# Patient Record
Sex: Male | Born: 1985 | Race: White | Hispanic: No | Marital: Single | Smoking: Former smoker
Health system: Southern US, Community
[De-identification: ages and names within clinical notes are randomized; demographics above are authoritative.]

## PROBLEM LIST (undated history)

## (undated) DIAGNOSIS — E119 Type 2 diabetes mellitus without complications: Secondary | ICD-10-CM

## (undated) DIAGNOSIS — I10 Essential (primary) hypertension: Secondary | ICD-10-CM

---

## 2016-04-02 ENCOUNTER — Emergency Department (HOSPITAL_COMMUNITY): Payer: No Typology Code available for payment source

## 2016-04-02 ENCOUNTER — Emergency Department (HOSPITAL_COMMUNITY)
Admission: EM | Admit: 2016-04-02 | Discharge: 2016-04-02 | Disposition: A | Discharge status: Home / Self Care | Payer: No Typology Code available for payment source | Attending: Emergency Medicine | Admitting: Emergency Medicine

## 2016-04-02 ENCOUNTER — Encounter (HOSPITAL_COMMUNITY): Payer: Self-pay

## 2016-04-02 DIAGNOSIS — Y939 Activity, unspecified: Secondary | ICD-10-CM | POA: Insufficient documentation

## 2016-04-02 DIAGNOSIS — S2232XA Fracture of one rib, left side, initial encounter for closed fracture: Secondary | ICD-10-CM | POA: Insufficient documentation

## 2016-04-02 DIAGNOSIS — F1910 Other psychoactive substance abuse, uncomplicated: Secondary | ICD-10-CM

## 2016-04-02 DIAGNOSIS — S20219A Contusion of unspecified front wall of thorax, initial encounter: Secondary | ICD-10-CM | POA: Insufficient documentation

## 2016-04-02 DIAGNOSIS — F1990 Other psychoactive substance use, unspecified, uncomplicated: Secondary | ICD-10-CM | POA: Diagnosis not present

## 2016-04-02 DIAGNOSIS — Z87891 Personal history of nicotine dependence: Secondary | ICD-10-CM | POA: Insufficient documentation

## 2016-04-02 DIAGNOSIS — Y999 Unspecified external cause status: Secondary | ICD-10-CM | POA: Diagnosis not present

## 2016-04-02 DIAGNOSIS — R1030 Lower abdominal pain, unspecified: Secondary | ICD-10-CM | POA: Insufficient documentation

## 2016-04-02 DIAGNOSIS — Y929 Unspecified place or not applicable: Secondary | ICD-10-CM | POA: Diagnosis not present

## 2016-04-02 DIAGNOSIS — S161XXA Strain of muscle, fascia and tendon at neck level, initial encounter: Secondary | ICD-10-CM | POA: Diagnosis not present

## 2016-04-02 DIAGNOSIS — S300XXA Contusion of lower back and pelvis, initial encounter: Secondary | ICD-10-CM | POA: Insufficient documentation

## 2016-04-02 DIAGNOSIS — S299XXA Unspecified injury of thorax, initial encounter: Secondary | ICD-10-CM | POA: Diagnosis present

## 2016-04-02 DIAGNOSIS — S2239XA Fracture of one rib, unspecified side, initial encounter for closed fracture: Secondary | ICD-10-CM

## 2016-04-02 DIAGNOSIS — R930 Abnormal findings on diagnostic imaging of skull and head, not elsewhere classified: Secondary | ICD-10-CM | POA: Insufficient documentation

## 2016-04-02 LAB — COMPREHENSIVE METABOLIC PANEL
ALT: 29 U/L (ref 17–63)
ANION GAP: 15 (ref 5–15)
AST: 32 U/L (ref 15–41)
Albumin: 4.4 g/dL (ref 3.5–5.0)
Alkaline Phosphatase: 57 U/L (ref 38–126)
BUN: 17 mg/dL (ref 6–20)
CHLORIDE: 105 mmol/L (ref 101–111)
CO2: 22 mmol/L (ref 22–32)
Calcium: 10.3 mg/dL (ref 8.9–10.3)
Creatinine, Ser: 1.17 mg/dL (ref 0.61–1.24)
GFR calc non Af Amer: >60 mL/min (ref >60)
Glucose, Bld: 115 mg/dL — ABNORMAL HIGH (ref 65–99)
POTASSIUM: 3.2 mmol/L — AB (ref 3.5–5.1)
SODIUM: 142 mmol/L (ref 135–145)
Total Bilirubin: 1.3 mg/dL — ABNORMAL HIGH (ref 0.3–1.2)
Total Protein: 7.9 g/dL (ref 6.5–8.1)

## 2016-04-02 LAB — CBC
HCT: 43.4 % (ref 39.0–52.0)
HEMOGLOBIN: 15.2 g/dL (ref 13.0–17.0)
MCH: 28.8 pg (ref 26.0–34.0)
MCHC: 35 g/dL (ref 30.0–36.0)
MCV: 82.4 fL (ref 78.0–100.0)
Platelets: 224 10*3/uL (ref 150–400)
RBC: 5.27 MIL/uL (ref 4.22–5.81)
RDW: 12 % (ref 11.5–15.5)
WBC: 13.7 10*3/uL — AB (ref 4.0–10.5)

## 2016-04-02 LAB — I-STAT CHEM 8, ED
BUN: 18 mg/dL (ref 6–20)
CALCIUM ION: 1.15 mmol/L (ref 1.15–1.40)
Chloride: 104 mmol/L (ref 101–111)
Creatinine, Ser: 1.1 mg/dL (ref 0.61–1.24)
GLUCOSE: 117 mg/dL — AB (ref 65–99)
HCT: 45 % (ref 39.0–52.0)
HEMOGLOBIN: 15.3 g/dL (ref 13.0–17.0)
POTASSIUM: 3.2 mmol/L — AB (ref 3.5–5.1)
Sodium: 141 mmol/L (ref 135–145)
TCO2: 22 mmol/L (ref 0–100)

## 2016-04-02 LAB — PROTIME-INR
INR: 1.19
Prothrombin Time: 15.2 seconds (ref 11.4–15.2)

## 2016-04-02 LAB — ETHANOL: Alcohol, Ethyl (B): <5 mg/dL (ref <5)

## 2016-04-02 MED ORDER — KETOROLAC TROMETHAMINE 30 MG/ML IJ SOLN
30.0000 mg | Freq: Once | INTRAMUSCULAR | Status: DC
  Filled 2016-04-02: qty 1

## 2016-04-02 MED ORDER — MORPHINE SULFATE (PF) 4 MG/ML IV SOLN
4.0000 mg | Freq: Once | INTRAVENOUS | Status: AC
  Administered 2016-04-02: 4 mg via INTRAVENOUS
  Filled 2016-04-02: qty 1

## 2016-04-02 MED ORDER — IOPAMIDOL (ISOVUE-300) INJECTION 61%
INTRAVENOUS | Status: AC
  Administered 2016-04-02: 100 mL
  Filled 2016-04-02: qty 100

## 2016-04-02 MED ORDER — ONDANSETRON HCL 4 MG/2ML IJ SOLN
4.0000 mg | Freq: Once | INTRAMUSCULAR | Status: AC
  Administered 2016-04-02: 4 mg via INTRAVENOUS
  Filled 2016-04-02: qty 2

## 2016-04-02 MED ORDER — OXYCODONE-ACETAMINOPHEN 5-325 MG PO TABS: 2.0000 | ORAL_TABLET | Freq: Four times a day (QID) | ORAL | 0 refills | Status: DC | PRN

## 2016-04-02 MED ORDER — KETOROLAC TROMETHAMINE 30 MG/ML IJ SOLN
30.0000 mg | Freq: Once | INTRAMUSCULAR | Status: AC
  Administered 2016-04-02: 30 mg via INTRAVENOUS

## 2016-04-02 MED ORDER — NAPROXEN 500 MG PO TABS: 500.0000 mg | ORAL_TABLET | Freq: Two times a day (BID) | ORAL | 0 refills | Status: AC
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
## 2016-04-02 NOTE — ED Notes (Signed)
Pt placed in c-collar per VO from Dr. Wilkie AyeHorton
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
## 2016-04-02 NOTE — ED Notes (Signed)
Patient transported to CT 

## 2016-04-02 NOTE — ED Notes (Addendum)
Pt given paper scrubs, wound on left leg dressed, c-collar remains in place for discharge. DC instructions reviewed and pt verbalizes understanding. NAD
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
## 2019-07-24 ENCOUNTER — Inpatient Hospital Stay (HOSPITAL_COMMUNITY)
Admission: EM | Admit: 2019-07-24 | Discharge: 2019-07-26 | DRG: 638 | Disposition: A | Discharge status: Home / Self Care | Payer: Self-pay | Attending: Internal Medicine | Admitting: Internal Medicine

## 2019-07-24 ENCOUNTER — Emergency Department (HOSPITAL_COMMUNITY): Payer: Self-pay

## 2019-07-24 ENCOUNTER — Encounter (HOSPITAL_COMMUNITY): Payer: Self-pay | Admitting: Emergency Medicine

## 2019-07-24 ENCOUNTER — Other Ambulatory Visit: Payer: Self-pay

## 2019-07-24 DIAGNOSIS — E1100 Type 2 diabetes mellitus with hyperosmolarity without nonketotic hyperglycemic-hyperosmolar coma (NKHHC): Principal | ICD-10-CM | POA: Diagnosis present

## 2019-07-24 DIAGNOSIS — N179 Acute kidney failure, unspecified: Secondary | ICD-10-CM | POA: Diagnosis present

## 2019-07-24 DIAGNOSIS — Z597 Insufficient social insurance and welfare support: Secondary | ICD-10-CM

## 2019-07-24 DIAGNOSIS — E0865 Diabetes mellitus due to underlying condition with hyperglycemia: Secondary | ICD-10-CM | POA: Diagnosis present

## 2019-07-24 DIAGNOSIS — I10 Essential (primary) hypertension: Secondary | ICD-10-CM | POA: Diagnosis present

## 2019-07-24 DIAGNOSIS — E871 Hypo-osmolality and hyponatremia: Secondary | ICD-10-CM | POA: Diagnosis present

## 2019-07-24 DIAGNOSIS — Z91120 Patient's intentional underdosing of medication regimen due to financial hardship: Secondary | ICD-10-CM

## 2019-07-24 DIAGNOSIS — Z8249 Family history of ischemic heart disease and other diseases of the circulatory system: Secondary | ICD-10-CM

## 2019-07-24 DIAGNOSIS — Z87891 Personal history of nicotine dependence: Secondary | ICD-10-CM

## 2019-07-24 DIAGNOSIS — Z6837 Body mass index (BMI) 37.0-37.9, adult: Secondary | ICD-10-CM

## 2019-07-24 DIAGNOSIS — E860 Dehydration: Secondary | ICD-10-CM | POA: Diagnosis present

## 2019-07-24 DIAGNOSIS — IMO0002 Reserved for concepts with insufficient information to code with codable children: Secondary | ICD-10-CM | POA: Diagnosis present

## 2019-07-24 DIAGNOSIS — Z20822 Contact with and (suspected) exposure to covid-19: Secondary | ICD-10-CM | POA: Diagnosis present

## 2019-07-24 DIAGNOSIS — T383X6A Underdosing of insulin and oral hypoglycemic [antidiabetic] drugs, initial encounter: Secondary | ICD-10-CM | POA: Diagnosis present

## 2019-07-24 HISTORY — DX: Type 2 diabetes mellitus without complications: E11.9

## 2019-07-24 HISTORY — DX: Essential (primary) hypertension: I10

## 2019-07-24 LAB — CBC
HCT: 46.3 % (ref 39.0–52.0)
Hemoglobin: 15.6 g/dL (ref 13.0–17.0)
MCH: 28.4 pg (ref 26.0–34.0)
MCHC: 33.7 g/dL (ref 30.0–36.0)
MCV: 84.3 fL (ref 80.0–100.0)
Platelets: 221 10*3/uL (ref 150–400)
RBC: 5.49 MIL/uL (ref 4.22–5.81)
RDW: 11.8 % (ref 11.5–15.5)
WBC: 6.5 10*3/uL (ref 4.0–10.5)
nRBC: 0 % (ref 0.0–0.2)

## 2019-07-24 LAB — BASIC METABOLIC PANEL
Anion gap: 15 (ref 5–15)
Anion gap: 9 (ref 5–15)
BUN: 18 mg/dL (ref 6–20)
BUN: 16 mg/dL (ref 6–20)
CO2: 22 mmol/L (ref 22–32)
CO2: 24 mmol/L (ref 22–32)
Calcium: 9.6 mg/dL (ref 8.9–10.3)
Calcium: 8.4 mg/dL — ABNORMAL LOW (ref 8.9–10.3)
Chloride: 91 mmol/L — ABNORMAL LOW (ref 98–111)
Chloride: 98 mmol/L (ref 98–111)
Creatinine, Ser: 1.18 mg/dL (ref 0.61–1.24)
Creatinine, Ser: 0.98 mg/dL (ref 0.61–1.24)
GFR calc Af Amer: >60 mL/min (ref >60)
GFR calc Af Amer: >60 mL/min (ref >60)
GFR calc non Af Amer: >60 mL/min (ref >60)
GFR calc non Af Amer: >60 mL/min (ref >60)
Glucose, Bld: 857 mg/dL (ref 70–99)
Glucose, Bld: 610 mg/dL (ref 70–99)
Potassium: 4.7 mmol/L (ref 3.5–5.1)
Potassium: 4.2 mmol/L (ref 3.5–5.1)
Sodium: 128 mmol/L — ABNORMAL LOW (ref 135–145)
Sodium: 131 mmol/L — ABNORMAL LOW (ref 135–145)

## 2019-07-24 LAB — URINALYSIS, ROUTINE W REFLEX MICROSCOPIC
Bacteria, UA: NONE SEEN
Bilirubin Urine: NEGATIVE
Glucose, UA: >=500 mg/dL — AB
Hgb urine dipstick: NEGATIVE
Ketones, ur: NEGATIVE mg/dL
Leukocytes,Ua: NEGATIVE
Nitrite: NEGATIVE
Protein, ur: NEGATIVE mg/dL
Specific Gravity, Urine: 1.027 (ref 1.005–1.030)
pH: 5 (ref 5.0–8.0)

## 2019-07-24 LAB — CBG MONITORING, ED
Glucose-Capillary: >600 mg/dL (ref 70–99)
Glucose-Capillary: >600 mg/dL (ref 70–99)
Glucose-Capillary: 572 mg/dL (ref 70–99)
Glucose-Capillary: 491 mg/dL — ABNORMAL HIGH (ref 70–99)

## 2019-07-24 LAB — GLUCOSE, CAPILLARY
Glucose-Capillary: 319 mg/dL — ABNORMAL HIGH (ref 70–99)
Glucose-Capillary: 446 mg/dL — ABNORMAL HIGH (ref 70–99)

## 2019-07-24 LAB — POC SARS CORONAVIRUS 2 AG -  ED: SARS Coronavirus 2 Ag: NEGATIVE

## 2019-07-24 LAB — TROPONIN I (HIGH SENSITIVITY)
Troponin I (High Sensitivity): 3 ng/L (ref <18)
Troponin I (High Sensitivity): 4 ng/L (ref <18)

## 2019-07-24 LAB — BRAIN NATRIURETIC PEPTIDE: B Natriuretic Peptide: 14.9 pg/mL (ref 0.0–100.0)

## 2019-07-24 LAB — OSMOLALITY: Osmolality: 311 mOsm/kg — ABNORMAL HIGH (ref 275–295)

## 2019-07-24 MED ORDER — ENOXAPARIN SODIUM 80 MG/0.8ML ~~LOC~~ SOLN
80.0000 mg | SUBCUTANEOUS | Status: DC
  Administered 2019-07-25 – 2019-07-26 (×2): 80 mg via SUBCUTANEOUS
  Filled 2019-07-24 (×2): qty 0.8

## 2019-07-24 MED ORDER — DEXTROSE-NACL 5-0.45 % IV SOLN: INTRAVENOUS | Status: DC

## 2019-07-24 MED ORDER — SODIUM CHLORIDE 0.9 % IV SOLN: INTRAVENOUS | Status: DC

## 2019-07-24 MED ORDER — SODIUM CHLORIDE 0.9 % IV BOLUS
20.0000 mL/kg | Freq: Once | INTRAVENOUS | Status: DC
  Administered 2019-07-24: 19:00:00 1000 mL via INTRAVENOUS

## 2019-07-24 MED ORDER — POTASSIUM CHLORIDE 10 MEQ/100ML IV SOLN
10.0000 meq | INTRAVENOUS | Status: AC
  Administered 2019-07-24 (×2): 10 meq via INTRAVENOUS
  Filled 2019-07-24 (×2): qty 100

## 2019-07-24 MED ORDER — INSULIN REGULAR(HUMAN) IN NACL 100-0.9 UT/100ML-% IV SOLN
INTRAVENOUS | Status: DC
  Administered 2019-07-24: 21:00:00 18 [IU]/h via INTRAVENOUS
  Filled 2019-07-24: qty 100

## 2019-07-24 MED ORDER — DEXTROSE 50 % IV SOLN: 0.0000 mL | INTRAVENOUS | Status: DC | PRN

## 2019-07-24 MED ORDER — SODIUM CHLORIDE 0.9 % IV BOLUS
20.0000 mL/kg | Freq: Once | INTRAVENOUS | Status: AC
  Administered 2019-07-24: 20:00:00 3208 mL via INTRAVENOUS

## 2019-07-24 MED ORDER — INSULIN REGULAR(HUMAN) IN NACL 100-0.9 UT/100ML-% IV SOLN: INTRAVENOUS | Status: DC

## 2019-07-24 NOTE — ED Notes (Signed)
Dr Jacqulyn Bath aware Glucose 857

## 2019-07-24 NOTE — ED Triage Notes (Signed)
Pt reports his blood sugar has been running high for a few months. States he takes his medications when he can afford them. Sob with exertion.

## 2019-07-24 NOTE — H&P (Signed)
History and Physical    Tim Choi GUR:[id number] DOB: [birth year] DOA: 07/24/2019  PCP: Patient, No Pcp Per  Patient coming from: Home.  Chief Complaint: Elevated blood sugar.  HPI: Tim Choi is a 34 y.o. male with history of diabetes mellitus probably type II on insulin and has not been taking it for last almost a month because of financial issues has been having elevated blood sugar readings in the 500s constantly and helps has benign fatigue and weakness.  Polyuria weight loss.  With the symptoms patient presents to the ER.  Denies chest pain nausea vomiting or abdominal pain no fever or chills.   ED Course: In the ER patient was afebrile.  Blood work show blood glucose of 857 with anion gap of 15 creatinine 1.1 sodium 128 CBC largely unremarkable EKG showing nonspecific changes Covid test was negative.  Given the hyperosmolar status patient started on IV fluids and IV insulin infusion and admitted for further management.  Review of Systems: As per HPI, rest all negative.   Past Medical History:  Diagnosis Date  . Diabetes mellitus without complication (HCC)   . Hypertension     History reviewed. No pertinent surgical history.   reports that he has quit smoking. He has never used smokeless tobacco. He reports previous alcohol use. He reports current drug use. Drugs: Methamphetamines and Marijuana.  No Known Allergies  Family History  Problem Relation Age of Onset  . CAD Father   . Diabetes Mellitus II Neg Hx     Prior to Admission medications   Medication Sig Start Date End Date Taking? Authorizing Provider  naproxen (NAPROSYN) 500 MG tablet Take 1 tablet (500 mg total) by mouth 2 (two) times daily. Patient not taking: Reported on 07/24/2019 04/02/16   Horton, Mayer Masker, MD  oxyCODONE-acetaminophen (PERCOCET/ROXICET) 5-325 MG tablet Take 2 tablets by mouth every 6 (six) hours as needed for severe pain. Patient not taking: Reported on 07/24/2019 04/02/16   Horton,  Mayer Masker, MD    Physical Exam: Constitutional: Moderately built and nourished. Vitals:   07/24/19 2000 07/24/19 2100 07/24/19 2110 07/24/19 2210  BP: 134/89 (!) 147/89  (!) 134/93  Pulse: 94 93  99  Resp: 16 16  20   Temp:    98.1 F (36.7 C)  TempSrc:    Oral  SpO2: 96% 97%  97%  Weight:    (!) 161.2 kg  Height:   6\' 9"  (2.057 m) 6\' 9"  (2.057 m)   Eyes: Anicteric no pallor. ENMT: No discharge from the ears eyes nose or mouth. Neck: No mass felt.  No neck rigidity. Respiratory: No rhonchi or crepitations. Cardiovascular: S1-S2 heard. Abdomen: Soft nontender bowel sounds present. Musculoskeletal: No edema.  No joint effusion. Skin: No rash. Neurologic: Alert awake oriented to time place and person.  Moves all extremities. Psychiatric: Appears normal per normal affect.   Labs on Admission: I have personally reviewed following labs and imaging studies  CBC: Recent Labs  Lab 07/24/19 1619  WBC 6.5  HGB 15.6  HCT 46.3  MCV 84.3  PLT 221   Basic Metabolic Panel: Recent Labs  Lab 07/24/19 1619 07/24/19 2105  NA 128* 131*  K 4.7 4.2  CL 91* 98  CO2 22 24  GLUCOSE 857* 610*  BUN 18 16  CREATININE 1.18 0.98  CALCIUM 9.6 8.4*   GFR: Estimated Creatinine Clearance: 187.3 mL/min (by C-G formula based on SCr of 0.98 mg/dL). Liver Function Tests: No results for input(s): AST, ALT, ALKPHOS,  BILITOT, PROT, ALBUMIN in the last 168 hours. No results for input(s): LIPASE, AMYLASE in the last 168 hours. No results for input(s): AMMONIA in the last 168 hours. Coagulation Profile: No results for input(s): INR, PROTIME in the last 168 hours. Cardiac Enzymes: No results for input(s): CKTOTAL, CKMB, CKMBINDEX, TROPONINI in the last 168 hours. BNP (last 3 results) No results for input(s): PROBNP in the last 8760 hours. HbA1C: No results for input(s): HGBA1C in the last 72 hours. CBG: Recent Labs  Lab 07/24/19 1619 07/24/19 2026 07/24/19 2109 07/24/19 2140  07/24/19 2217  GLUCAP >600* >600* 572* 491* 319*   Lipid Profile: No results for input(s): CHOL, HDL, LDLCALC, TRIG, CHOLHDL, LDLDIRECT in the last 72 hours. Thyroid Function Tests: No results for input(s): TSH, T4TOTAL, FREET4, T3FREE, THYROIDAB in the last 72 hours. Anemia Panel: No results for input(s): VITAMINB12, FOLATE, FERRITIN, TIBC, IRON, RETICCTPCT in the last 72 hours. Urine analysis:    Component Value Date/Time   COLORURINE STRAW (A) 07/24/2019 1630   APPEARANCEUR CLEAR 07/24/2019 1630   LABSPEC 1.027 07/24/2019 1630   PHURINE 5.0 07/24/2019 1630   GLUCOSEU >=500 (A) 07/24/2019 1630   HGBUR NEGATIVE 07/24/2019 1630   BILIRUBINUR NEGATIVE 07/24/2019 1630   KETONESUR NEGATIVE 07/24/2019 1630   PROTEINUR NEGATIVE 07/24/2019 1630   NITRITE NEGATIVE 07/24/2019 1630   LEUKOCYTESUR NEGATIVE 07/24/2019 1630   Sepsis Labs: @LABRCNTIP (procalcitonin:4,lacticidven:4) )No results found for this or any previous visit (from the past 240 hour(s)).   Radiological Exams on Admission: DG Chest Port 1 View  Result Date: 07/24/2019 CLINICAL DATA:  34 year old male with shortness of breath hypoglycemia. EXAM: PORTABLE CHEST 1 VIEW COMPARISON:  Portable chest 04/02/2016. FINDINGS: Portable AP upright views at 1930 hours. Mildly increased elevation of the right hemidiaphragm since 2017. Mediastinal contours remain normal. Visualized tracheal air column is within normal limits. Allowing for portable technique the lungs are clear. No pneumothorax. Paucity of bowel gas in the upper abdomen. No osseous abnormality identified. IMPRESSION: Negative portable chest. Electronically Signed   By: Genevie Ann M.D.   On: 07/24/2019 19:39    EKG: Independently reviewed.  Normal sinus rhythm with nonspecific changes.  Assessment/Plan Principal Problem:   Type 2 diabetes mellitus with hyperosmolar nonketotic hyperglycemia (HCC) Active Problems:   Diabetes mellitus due to underlying condition, uncontrolled  (HCC)   Hyperosmolar non-ketotic state due to type 2 diabetes mellitus ([place])    1. Hyperosmolar nonketotic uncontrolled diabetes mellitus type 2 likely from noncompliance with medication for which patient has been started on IV fluids and IV insulin infusion.  Check hemoglobin A1c.  Once blood sugar is less than 250 will change patient's insulin infusion to Lantus or other long-acting insulin with sliding scale coverage.  We will get the case manager consult with regarding the patient's medications and follow-up.  Advised about being compliant with medications. 2. Acute renal failure and hyponatremia likely from hyperglycemia and dehydration for which patient is receiving fluids and insulin.  Follow metabolic panel closely. 3. History of hypertension presently not on any medications.  Patient used to take lisinopril which patient stopped because blood pressure improved.  Closely follow blood pressure trends.  May have to restart if blood pressure continues to remain high.   DVT prophylaxis: Lovenox. Code Status: Full code. Family Communication: Discussed with patient. Disposition Plan: Home. Consults called: None. Admission status: Observation.   Rise Patience MD [facility] Pager [phone].  If 7PM-7AM, please contact night-coverage [email or web address] Password [facility]  07/24/2019, 11:13 PM

## 2019-07-24 NOTE — ED Provider Notes (Signed)
[facility] EMERGENCY DEPARTMENT Provider Note   CSN: [id number] Arrival date & time: 07/24/19  1522     History Chief Complaint  Patient presents with  . Hyperglycemia    Tim Choi is a 34 y.o. male with a past medical history of diabetes and hypertension.  Patient has no health insurance and no primary care doctor to manage his chronic medical discharge conditions.  He states that he has been buying long-acting and Dacquisto acting insulin over-the-counter and he was taught how to try to carb count however he has been unable to get his blood sugars below 500 on a regular basis and also states that he has not been able to afford his insulin for a few weeks.  He has noticed that he has been having voluminous urinations without burning, hematuria, or suprapubic pain.  He is also been feeling extremely fatigued and states that he has become noticeably more [place] of breath.  He has 3 flights of stairs at his house and states that by the time he reaches the third floor he is totally gasping for air and has to stop.  This is new for him.  He denies fevers, chills, abdominal pain, chest pain, or known Covid exposures.  Vomiting.  HPI     Past Medical History:  Diagnosis Date  . Diabetes mellitus without complication (HCC)   . Hypertension     There are no problems to display for this patient.   History reviewed. No pertinent surgical history.     No family history on file.  Social History   Tobacco Use  . Smoking status: Former Games developer  . Smokeless tobacco: Never Used  Substance Use Topics  . Alcohol use: Not on file  . Drug use: Yes    Types: Methamphetamines, Marijuana    Home Medications Prior to Admission medications   Medication Sig Start Date End Date Taking? Authorizing Provider  naproxen (NAPROSYN) 500 MG tablet Take 1 tablet (500 mg total) by mouth 2 (two) times daily. 04/02/16   Horton, Mayer Masker, MD  oxyCODONE-acetaminophen (PERCOCET/ROXICET)  5-325 MG tablet Take 2 tablets by mouth every 6 (six) hours as needed for severe pain. 04/02/16   Horton, Mayer Masker, MD    Allergies    Patient has no known allergies.  Review of Systems   Review of Systems Ten systems reviewed and are negative for acute change, except as noted in the HPI.   Physical Exam Updated Vital Signs BP (!) 141/101 (BP Location: Right Arm)   Pulse (!) 101   Temp 99 F (37.2 C) (Oral)   Resp 16   SpO2 95%   Physical Exam Vitals and nursing note reviewed.  Constitutional:      General: He is not in acute distress.    Appearance: He is well-developed. He is not diaphoretic.  HENT:     Head: Normocephalic and atraumatic.  Eyes:     General: No scleral icterus.    Conjunctiva/sclera: Conjunctivae normal.  Cardiovascular:     Rate and Rhythm: Normal rate and regular rhythm.     Heart sounds: Normal heart sounds.  Pulmonary:     Effort: Pulmonary effort is normal. No respiratory distress.     Breath sounds: Normal breath sounds.  Abdominal:     Palpations: Abdomen is soft.     Tenderness: There is no abdominal tenderness.  Musculoskeletal:     Cervical back: Normal range of motion and neck supple.  Skin:    General:  Skin is warm and dry.  Neurological:     Mental Status: He is alert.  Psychiatric:        Behavior: Behavior normal.     ED Results / Procedures / Treatments   Labs (all labs ordered are listed, but only abnormal results are displayed) Labs Reviewed  BASIC METABOLIC PANEL - Abnormal; Notable for the following components:      Result Value   Sodium 128 (*)    Chloride 91 (*)    Glucose, Bld 857 (*)    All other components within normal limits  URINALYSIS, ROUTINE W REFLEX MICROSCOPIC - Abnormal; Notable for the following components:   Color, Urine STRAW (*)    Glucose, UA >=500 (*)    All other components within normal limits  CBG MONITORING, ED - Abnormal; Notable for the following components:   Glucose-Capillary >600 (*)      All other components within normal limits  CBC  OSMOLALITY    EKG None  Radiology No results found.  Procedures .Critical Care Performed by: Margarita Mail, PA-C Authorized by: Margarita Mail, PA-C   Critical care provider statement:    Critical care time (minutes):  50   Critical care time was exclusive of:  Separately billable procedures and treating other patients   Critical care was necessary to treat or prevent imminent or life-threatening deterioration of the following conditions:  Metabolic crisis   Critical care was time spent personally by me on the following activities:  Discussions with consultants, evaluation of patient's response to treatment, examination of patient, ordering and performing treatments and interventions, ordering and review of laboratory studies, ordering and review of radiographic studies, pulse oximetry, re-evaluation of patient's condition, obtaining history from patient or surrogate and review of old charts   (including critical care time)  Medications Ordered in ED Medications  insulin regular, human (MYXREDLIN) 100 units/ 100 mL infusion (has no administration in time range)  0.9 %  sodium chloride infusion (has no administration in time range)  dextrose 5 %-0.45 % sodium chloride infusion (has no administration in time range)  dextrose 50 % solution 0-50 mL (has no administration in time range)  sodium chloride 0.9 % bolus 20 mL/kg (has no administration in time range)  potassium chloride 10 mEq in 100 mL IVPB (has no administration in time range)    ED Course  I have reviewed the triage vital signs and the nursing notes.  Pertinent labs & imaging results that were available during my care of the patient were reviewed by me and considered in my medical decision making (see chart for details).    MDM Rules/Calculators/A&P                     34 year old male with history of insulin-dependent diabetes has been out of his medications for  several week and having difficulty controlling his sugars at home.  Patient presented to the emergency department with markedly elevated blood sugars.  Review of labs shows that his osmolality is slightly elevated.  Urine shows high glucose level.  CBC without elevated white blood cell count.  Normal BNP.  Patient has no ketones or anion gap suggestive of DKA.  Patient receiving fluids, IV insulin.  He will be need admission for hyperosmolar hyperglycemic state. Final Clinical Impression(s) / ED Diagnoses Final diagnoses:  Hyperosmolar hyperglycemic state (HHS) [facility])    Rx / DC Orders ED Discharge Orders    None       Margarita Mail,  PA-C 07/24/19 2324    Maia Plan, MD 07/25/19 1252

## 2019-07-25 ENCOUNTER — Telehealth: Payer: Self-pay

## 2019-07-25 DIAGNOSIS — E1100 Type 2 diabetes mellitus with hyperosmolarity without nonketotic hyperglycemic-hyperosmolar coma (NKHHC): Secondary | ICD-10-CM | POA: Diagnosis present

## 2019-07-25 LAB — BASIC METABOLIC PANEL
Anion gap: 10 (ref 5–15)
Anion gap: 10 (ref 5–15)
Anion gap: 11 (ref 5–15)
BUN: 12 mg/dL (ref 6–20)
BUN: 13 mg/dL (ref 6–20)
BUN: 15 mg/dL (ref 6–20)
CO2: 25 mmol/L (ref 22–32)
CO2: 26 mmol/L (ref 22–32)
CO2: 26 mmol/L (ref 22–32)
Calcium: 8.8 mg/dL — ABNORMAL LOW (ref 8.9–10.3)
Calcium: 8.9 mg/dL (ref 8.9–10.3)
Calcium: 9.1 mg/dL (ref 8.9–10.3)
Chloride: 102 mmol/L (ref 98–111)
Chloride: 102 mmol/L (ref 98–111)
Chloride: 103 mmol/L (ref 98–111)
Creatinine, Ser: 0.77 mg/dL (ref 0.61–1.24)
Creatinine, Ser: 0.8 mg/dL (ref 0.61–1.24)
Creatinine, Ser: 0.89 mg/dL (ref 0.61–1.24)
GFR calc Af Amer: >60 mL/min (ref >60)
GFR calc Af Amer: >60 mL/min (ref >60)
GFR calc Af Amer: >60 mL/min (ref >60)
GFR calc non Af Amer: >60 mL/min (ref >60)
GFR calc non Af Amer: >60 mL/min (ref >60)
GFR calc non Af Amer: >60 mL/min (ref >60)
Glucose, Bld: 222 mg/dL — ABNORMAL HIGH (ref 70–99)
Glucose, Bld: 243 mg/dL — ABNORMAL HIGH (ref 70–99)
Glucose, Bld: 345 mg/dL — ABNORMAL HIGH (ref 70–99)
Potassium: 3.5 mmol/L (ref 3.5–5.1)
Potassium: 4.1 mmol/L (ref 3.5–5.1)
Potassium: 4.1 mmol/L (ref 3.5–5.1)
Sodium: 138 mmol/L (ref 135–145)
Sodium: 138 mmol/L (ref 135–145)
Sodium: 139 mmol/L (ref 135–145)

## 2019-07-25 LAB — GLUCOSE, CAPILLARY
Glucose-Capillary: 158 mg/dL — ABNORMAL HIGH (ref 70–99)
Glucose-Capillary: 225 mg/dL — ABNORMAL HIGH (ref 70–99)
Glucose-Capillary: 290 mg/dL — ABNORMAL HIGH (ref 70–99)
Glucose-Capillary: 312 mg/dL — ABNORMAL HIGH (ref 70–99)
Glucose-Capillary: 380 mg/dL — ABNORMAL HIGH (ref 70–99)
Glucose-Capillary: 408 mg/dL — ABNORMAL HIGH (ref 70–99)

## 2019-07-25 LAB — SARS CORONAVIRUS 2 (TAT 6-24 HRS): SARS Coronavirus 2: NEGATIVE

## 2019-07-25 LAB — CBC
HCT: 41.5 % (ref 39.0–52.0)
Hemoglobin: 14.7 g/dL (ref 13.0–17.0)
MCH: 28.8 pg (ref 26.0–34.0)
MCHC: 35.4 g/dL (ref 30.0–36.0)
MCV: 81.4 fL (ref 80.0–100.0)
Platelets: 201 10*3/uL (ref 150–400)
RBC: 5.1 MIL/uL (ref 4.22–5.81)
RDW: 11.8 % (ref 11.5–15.5)
WBC: 6.8 10*3/uL (ref 4.0–10.5)
nRBC: 0 % (ref 0.0–0.2)

## 2019-07-25 LAB — HIV ANTIBODY (ROUTINE TESTING W REFLEX): HIV Screen 4th Generation wRfx: NONREACTIVE

## 2019-07-25 LAB — OSMOLALITY: Osmolality: 296 mOsm/kg — ABNORMAL HIGH (ref 275–295)

## 2019-07-25 MED ORDER — PROMETHAZINE HCL 25 MG/ML IJ SOLN
12.5000 mg | Freq: Four times a day (QID) | INTRAMUSCULAR | Status: DC | PRN
  Filled 2019-07-25: qty 1

## 2019-07-25 MED ORDER — LISINOPRIL 10 MG PO TABS
10.0000 mg | ORAL_TABLET | Freq: Every day | ORAL | Status: DC
  Administered 2019-07-25 – 2019-07-26 (×2): 10 mg via ORAL
  Filled 2019-07-25 (×2): qty 1

## 2019-07-25 MED ORDER — ACETAMINOPHEN 325 MG PO TABS
650.0000 mg | ORAL_TABLET | Freq: Four times a day (QID) | ORAL | Status: DC | PRN
  Administered 2019-07-25: 08:00:00 650 mg via ORAL
  Filled 2019-07-25: qty 2

## 2019-07-25 MED ORDER — INSULIN ASPART 100 UNIT/ML ~~LOC~~ SOLN
0.0000 [IU] | Freq: Every day | SUBCUTANEOUS | Status: DC
  Administered 2019-07-25: 22:00:00 4 [IU] via SUBCUTANEOUS

## 2019-07-25 MED ORDER — INSULIN ASPART 100 UNIT/ML ~~LOC~~ SOLN
0.0000 [IU] | Freq: Three times a day (TID) | SUBCUTANEOUS | Status: DC
  Administered 2019-07-25: 08:00:00 8 [IU] via SUBCUTANEOUS

## 2019-07-25 MED ORDER — INSULIN GLARGINE 100 UNIT/ML ~~LOC~~ SOLN
20.0000 [IU] | Freq: Every day | SUBCUTANEOUS | Status: DC
  Filled 2019-07-25: qty 0.2

## 2019-07-25 MED ORDER — INSULIN GLARGINE 100 UNIT/ML ~~LOC~~ SOLN
40.0000 [IU] | Freq: Every day | SUBCUTANEOUS | Status: DC
  Administered 2019-07-25: 02:00:00 40 [IU] via SUBCUTANEOUS
  Filled 2019-07-25 (×2): qty 0.4

## 2019-07-25 MED ORDER — HYDROMORPHONE HCL 1 MG/ML IJ SOLN
1.0000 mg | Freq: Once | INTRAMUSCULAR | Status: AC
  Administered 2019-07-25: 12:00:00 1 mg via INTRAVENOUS
  Filled 2019-07-25: qty 1

## 2019-07-25 MED ORDER — INSULIN ASPART 100 UNIT/ML ~~LOC~~ SOLN
10.0000 [IU] | Freq: Three times a day (TID) | SUBCUTANEOUS | Status: DC
  Administered 2019-07-25 – 2019-07-26 (×3): 10 [IU] via SUBCUTANEOUS

## 2019-07-25 MED ORDER — INSULIN ASPART 100 UNIT/ML ~~LOC~~ SOLN
0.0000 [IU] | Freq: Three times a day (TID) | SUBCUTANEOUS | Status: DC
  Administered 2019-07-25: 18:00:00 20 [IU] via SUBCUTANEOUS
  Administered 2019-07-26: 12:00:00 7 [IU] via SUBCUTANEOUS
  Administered 2019-07-26: 08:00:00 15 [IU] via SUBCUTANEOUS

## 2019-07-25 MED ORDER — KETOROLAC TROMETHAMINE 30 MG/ML IJ SOLN
30.0000 mg | Freq: Three times a day (TID) | INTRAMUSCULAR | Status: DC | PRN
  Administered 2019-07-25: 10:00:00 30 mg via INTRAVENOUS
  Filled 2019-07-25: qty 1

## 2019-07-25 MED ORDER — INSULIN ASPART 100 UNIT/ML ~~LOC~~ SOLN
20.0000 [IU] | Freq: Once | SUBCUTANEOUS | Status: AC
  Administered 2019-07-25: 20 [IU] via SUBCUTANEOUS

## 2019-07-25 MED ORDER — INSULIN GLARGINE 100 UNIT/ML ~~LOC~~ SOLN
20.0000 [IU] | Freq: Two times a day (BID) | SUBCUTANEOUS | Status: DC
  Filled 2019-07-25: qty 0.2

## 2019-07-25 MED ORDER — METOCLOPRAMIDE HCL 5 MG PO TABS
5.0000 mg | ORAL_TABLET | Freq: Three times a day (TID) | ORAL | Status: DC
  Administered 2019-07-25 – 2019-07-26 (×3): 5 mg via ORAL
  Filled 2019-07-25 (×3): qty 1

## 2019-07-25 MED ORDER — INSULIN ASPART 100 UNIT/ML ~~LOC~~ SOLN: 6.0000 [IU] | Freq: Three times a day (TID) | SUBCUTANEOUS | Status: DC

## 2019-07-25 MED ORDER — ONDANSETRON HCL 4 MG/2ML IJ SOLN
4.0000 mg | Freq: Four times a day (QID) | INTRAMUSCULAR | Status: DC | PRN
  Administered 2019-07-25: 10:00:00 4 mg via INTRAVENOUS
  Filled 2019-07-25: qty 2

## 2019-07-25 MED ORDER — INSULIN GLARGINE 100 UNIT/ML ~~LOC~~ SOLN
30.0000 [IU] | Freq: Two times a day (BID) | SUBCUTANEOUS | Status: DC
  Administered 2019-07-25: 30 [IU] via SUBCUTANEOUS
  Filled 2019-07-25 (×3): qty 0.3

## 2019-07-25 NOTE — Progress Notes (Signed)
Toniann Fail MD made aware of patient BMP results, current FSBS, and endo tool protocol. MD chose to start SSI and Lantus and d/c insulin gtt, IV fluids and endo tool. RN will follow new orders and continue to monitor patient closely.

## 2019-07-25 NOTE — Care Management (Signed)
1715 07-25-19 Case Manager received referral for primary care provider and medication assistance. Case Manager then reached out to Robyne Peers Case Production designer, theatre/television/film at the [facility]. Erskine Squibb was able to schedule the patient an appointment and it has been placed on the after visit summary for hospital follow up. Patient was consulted by the diabetes coordinator- [facility] only has Basaglar and Humalog available at this time. Case Manager will continue to follow for additional transition of care needs. Gala Lewandowsky, RN,BSN Case Manager [phone]

## 2019-07-25 NOTE — Progress Notes (Signed)
PROGRESS NOTE    Tim Choi  YHC:[id number] DOB: April 06, 1986 DOA: 07/24/2019 PCP: Patient, No Pcp Per   Brief Narrative:  Tim Choi is a 34 y.o. male with history of diabetes mellitus probably type II on insulin and has not been taking it for last almost a month because of financial issues came to ED with fatigue and weakness.  Found to have elevated blood sugars and 800.  Admitted for HHS.  Initially treated with insulin infusion.  Subjective: Patient was complaining of generalized malaise and nausea.  No vomiting.  No fever or chills.  Denies any sick contacts.  COVID-19 negative.  Assessment & Plan:   Principal Problem:   Type 2 diabetes mellitus with hyperosmolar nonketotic hyperglycemia (HCC) Active Problems:   Diabetes mellitus due to underlying condition, uncontrolled (HCC)   Hyperosmolar non-ketotic state due to type 2 diabetes mellitus (HCC)   Hyperosmolar hyperglycemic state (HHS) (HCC)  Hyperosmolar nonketotic uncontrolled diabetes mellitus type 2. Likely secondary to not using his meds.  He was using NPH from [employer] 20 units in the morning, 28 at night with regular insulin for mealtime coverage up to 20 units. A1c elevated at 16.5. CBG in 300-400s Insulin infusion has been discontinued and he was placed on sliding scale. -Add Lantus 30 units twice daily. -NovoLog 10 units with meals. -SSI resistant scale. -New to monitor.  Generalized malaise and nausea.  Patient was complaining of feeling nauseated after eating anything.  May be some element of gastroparesis.  No documentation.  Might be some viral illness causing generalized malaise.  COVID-19 negative. -Supportive care. -Add Reglan 5 mg 3 times a day before meals and see the response.  Elevated blood pressure. -No prior history of hypertension. -We will add lisinopril 10 mg daily and monitor.  Electrolyte abnormalities.  Resolved  Objective: Vitals:   07/25/19 0509 07/25/19 0748 07/25/19 0913 07/25/19  1145  BP: (!) 144/94 (!) 149/106  (!) 169/106  Pulse: 82 85  89  Resp: 20     Temp: 97.8 F (36.6 C) 97.9 F (36.6 C) 98.1 F (36.7 C) 98.2 F (36.8 C)  TempSrc: Oral Oral Oral Oral  SpO2: 100% 100%  99%  Weight:      Height:        Intake/Output Summary (Last 24 hours) at 07/25/2019 1516 Last data filed at 07/25/2019 0800 Gross per 24 hour  Intake 815.21 ml  Output --  Net 815.21 ml   Filed Weights   07/24/19 1929 07/24/19 2210  Weight: (!) 160.4 kg (!) 161.2 kg    Examination:  General exam: Morbidly obese gentleman, appears calm and comfortable  Respiratory system: Clear to auscultation. Respiratory effort normal. Cardiovascular system: S1 & S2 heard, RRR. No JVD, murmurs, rubs, gallops or clicks. Gastrointestinal system: Soft, nontender, nondistended, bowel sounds positive. Central nervous system: Alert and oriented. No focal neurological deficits.Symmetric 5 x 5 power. Extremities: No edema, no cyanosis, pulses intact and symmetrical. Skin: No rashes, lesions or ulcers Psychiatry: Judgement and insight appear normal. Mood & affect appropriate.    DVT prophylaxis: Lovenox Code Status: Full Family Communication: No family at bedside Disposition Plan: Pending improvement-most likely home tomorrow.  Consultants:   None  Procedures:  Antimicrobials:   Data Reviewed: I have personally reviewed following labs and imaging studies  CBC: Recent Labs  Lab 07/24/19 1619 07/24/19 2347  WBC 6.5 6.8  HGB 15.6 14.7  HCT 46.3 41.5  MCV 84.3 81.4  PLT 221 201   Basic Metabolic Panel: Recent  Labs  Lab 07/24/19 1619 07/24/19 2105 07/24/19 2347 07/25/19 0255 07/25/19 0657  NA 128* 131* 138 138 139  K 4.7 4.2 3.5 4.1 4.1  CL 91* 98 103 102 102  CO2 22 24 25 26 26   GLUCOSE 857* 610* 222* 243* 345*  BUN 18 16 15 13 12   CREATININE 1.18 0.98 0.80 0.77 0.89  CALCIUM 9.6 8.4* 9.1 8.9 8.8*   GFR: Estimated Creatinine Clearance: 206.2 mL/min (by C-G formula  based on SCr of 0.89 mg/dL). Liver Function Tests: No results for input(s): AST, ALT, ALKPHOS, BILITOT, PROT, ALBUMIN in the last 168 hours. No results for input(s): LIPASE, AMYLASE in the last 168 hours. No results for input(s): AMMONIA in the last 168 hours. Coagulation Profile: No results for input(s): INR, PROTIME in the last 168 hours. Cardiac Enzymes: No results for input(s): CKTOTAL, CKMB, CKMBINDEX, TROPONINI in the last 168 hours. BNP (last 3 results) No results for input(s): PROBNP in the last 8760 hours. HbA1C: Recent Labs    07/24/19 2347  HGBA1C 16.5*   CBG: Recent Labs  Lab 07/24/19 2321 07/25/19 0027 07/25/19 0125 07/25/19 0745 07/25/19 1203  GLUCAP 446* 158* 225* 290* 408*   Lipid Profile: No results for input(s): CHOL, HDL, LDLCALC, TRIG, CHOLHDL, LDLDIRECT in the last 72 hours. Thyroid Function Tests: No results for input(s): TSH, T4TOTAL, FREET4, T3FREE, THYROIDAB in the last 72 hours. Anemia Panel: No results for input(s): VITAMINB12, FOLATE, FERRITIN, TIBC, IRON, RETICCTPCT in the last 72 hours. Sepsis Labs: No results for input(s): PROCALCITON, LATICACIDVEN in the last 168 hours.  Recent Results (from the past 240 hour(s))  SARS CORONAVIRUS 2 (TAT 6-24 HRS) Nasopharyngeal Nasopharyngeal Swab     Status: None   Collection Time: 07/24/19  8:14 PM   Specimen: Nasopharyngeal Swab  Result Value Ref Range Status   SARS Coronavirus 2 NEGATIVE NEGATIVE Final    Comment: (NOTE) SARS-CoV-2 target nucleic acids are NOT DETECTED. The SARS-CoV-2 RNA is generally detectable in upper and lower respiratory specimens during the acute phase of infection. Negative results do not preclude SARS-CoV-2 infection, do not rule out co-infections with other pathogens, and should not be used as the sole basis for treatment or other patient management decisions. Negative results must be combined with clinical observations, patient history, and epidemiological information.  The expected result is Negative. Fact Sheet for Patients: SugarRoll.be Fact Sheet for Healthcare Providers: https://www.woods-mathews.com/ This test is not yet approved or cleared by the [place] FDA and  has been authorized for detection and/or diagnosis of SARS-CoV-2 by FDA under an Emergency Use Authorization (EUA). This EUA will remain  in effect (meaning this test can be used) for the duration of the COVID-19 declaration under Section 56 4(b)(1) of the Act, 21 U.S.C. section 360bbb-3(b)(1), unless the authorization is terminated or revoked sooner. Performed at [facility], [place] [address], [place], [place] [address]      Radiology Studies: DG Chest [place] 1 View  Result Date: 07/24/2019 CLINICAL DATA:  34 year old male with shortness of breath hypoglycemia. EXAM: PORTABLE CHEST 1 VIEW COMPARISON:  Portable chest 04/02/2016. FINDINGS: Portable AP upright views at 1930 hours. Mildly increased elevation of the right hemidiaphragm since 2017. Mediastinal contours remain normal. Visualized tracheal air column is within normal limits. Allowing for portable technique the lungs are clear. No pneumothorax. Paucity of bowel gas in the upper abdomen. No osseous abnormality identified. IMPRESSION: Negative portable chest. Electronically Signed   By: Genevie Ann M.D.   On: 07/24/2019 19:39  Scheduled Meds: . enoxaparin (LOVENOX) injection  80 mg Subcutaneous Q24H  . insulin aspart  0-20 Units Subcutaneous TID WC  . insulin aspart  0-5 Units Subcutaneous QHS  . insulin aspart  10 Units Subcutaneous TID WC  . insulin glargine  30 Units Subcutaneous BID  . metoCLOPramide  5 mg Oral TID AC   Continuous Infusions:   LOS: 0 days   Time spent: 35 minutes.  Arnetha Courser, MD [facility] Pager [phone]  If 7PM-7AM, please contact night-coverage [email or web address] Password [facility] 07/25/2019, 3:16 PM   This record has been created using  Dragon voice recognition software. Errors have been sought and corrected,but may not always be located. Such creation errors do not reflect on the standard of care.

## 2019-07-25 NOTE — Telephone Encounter (Signed)
Call received from Tomi Bamberger, RN CM requesting a hospital follow up appointment for the patient.  Informed her that an appt has been scheduled for 07/29/2019 @ 1050 at [facility].   Also informed her that [facility] through [facility] would be able to provide him with basaglar and humalog for no charge if he meets eligibility criteria.

## 2019-07-25 NOTE — Social Work (Signed)
Pt from home, acknowledging consult for [facility] team for HH/DME/PCP/Medication Assistance.   CSW/TOC team continuing to follow for support with disposition when medically appropriate.  Octavio Graves, MSW, LCSWA [place] Clinical Social Work

## 2019-07-25 NOTE — Progress Notes (Signed)
Inpatient Diabetes Program Recommendations  AACE/ADA: New Consensus Statement on Inpatient Glycemic Control (2015)  Target Ranges:  Prepandial:   less than 140 mg/dL      Peak postprandial:   less than 180 mg/dL (1-2 hours)      Critically ill patients:  140 - 180 mg/dL   Lab Results  Component Value Date   GLUCAP 408 (H) 07/25/2019   HGBA1C 16.5 (H) 07/24/2019    Review of Glycemic Control  Diabetes history: DM type 2 Outpatient Diabetes medications: Novolin N (NPH) 20 units qam, 28 units qpm, Novolin R (regular) sometimes 20 units tid meal coverage in addition to correction scale (vial and syringes)  Current orders for Inpatient glycemic control:  currently being adjusted  Inpatient Diabetes Program Recommendations:    Consider Lantus 35-40 units bid Consider increasing Novolog meal coverage to 10 units tid.  Spoke with pt at bedside. Pt does not work and relies on his fiance when able to get money to get insulin from [place]. Pt uses NPH and regular insulin.  Discussed A1c. Discussed glucose and A1c goals. Reviewed glucose trends with patient and explained he needs more basal insulin. Pt reports gaining weight recently endorsing not being able to eat healthy things "because is costs more to eat healthy."  Hopefully can get pt set up at one of our clinics in order to get medications/insulin. Spoke with RN Case Manager about pt case.  Pt had 2 orders for Lantus in for tonight. Discussed with Dr. Shanda Bumps. Will watch glucose trends at this time and reassess in the morning.   Thanks,  Christena Deem RN, MSN, BC-ADM Inpatient Diabetes Coordinator Team Pager [phone] (8a-5p)

## 2019-07-25 NOTE — Progress Notes (Signed)
Patient states he is wanting to go home again and wants to talk to the MD. MD notified. Dr. Nelson Chimes called and asked to speak to the patient, I put the phone on speaker so MD could talk to him. MD explained the risks of him leaving tonight without a follow up appointment and his medications. Patient agrees to stay tonight and requests to leave by tomorrow morning.

## 2019-07-25 NOTE — Progress Notes (Signed)
Patient complainig of nausea, chills and body aches. Notified MD, new orders for tylenol, Zofran and Toradol.  Patient still c/o nausea and body aches 1 hour after medications given. MD notified, dilaudid and phenergan ordered.  Patient refused phenergan and stated we wants dilaudid. Shortly after receiving dilaudid patient stated he wanted to go home because he is worried he will not have a ride home tomorrow. I informed him that since he just received IV medication and his blood sugar is still uncontrolled he is unable to go home at the moment. I also told him that if he does not have a ride home that transportation can be provided. Patient agreeable to stay in hospital.

## 2019-07-26 LAB — GLUCOSE, CAPILLARY
Glucose-Capillary: 302 mg/dL — ABNORMAL HIGH (ref 70–99)
Glucose-Capillary: 230 mg/dL — ABNORMAL HIGH (ref 70–99)
Glucose-Capillary: 275 mg/dL — ABNORMAL HIGH (ref 70–99)

## 2019-07-26 LAB — HEMOGLOBIN A1C
Hgb A1c MFr Bld: >15.5 % — ABNORMAL HIGH (ref 4.8–5.6)
Mean Plasma Glucose: >398 mg/dL

## 2019-07-26 MED ORDER — BLOOD GLUCOSE MONITOR KIT: PACK | 0 refills | Status: AC

## 2019-07-26 MED ORDER — METFORMIN HCL 1000 MG PO TABS: 1000.0000 mg | ORAL_TABLET | Freq: Two times a day (BID) | ORAL | 11 refills | Status: AC

## 2019-07-26 MED ORDER — INSULIN LISPRO 100 UNIT/ML ~~LOC~~ SOLN: SUBCUTANEOUS | 3 refills | Status: AC

## 2019-07-26 MED ORDER — BASAGLAR KWIKPEN 100 UNIT/ML ~~LOC~~ SOPN: 35.0000 [IU] | PEN_INJECTOR | Freq: Every day | SUBCUTANEOUS | 3 refills | Status: DC

## 2019-07-26 MED ORDER — LISINOPRIL 10 MG PO TABS: 10.0000 mg | ORAL_TABLET | Freq: Every day | ORAL | 1 refills | Status: AC

## 2019-07-26 MED ORDER — INSULIN GLARGINE 100 UNIT/ML ~~LOC~~ SOLN
35.0000 [IU] | Freq: Two times a day (BID) | SUBCUTANEOUS | Status: DC
  Administered 2019-07-26: 09:00:00 35 [IU] via SUBCUTANEOUS
  Filled 2019-07-26 (×2): qty 0.35

## 2019-07-26 MED ORDER — INSULIN GLARGINE 100 UNIT/ML ~~LOC~~ SOLN: 35.0000 [IU] | Freq: Two times a day (BID) | SUBCUTANEOUS | 11 refills | Status: DC

## 2019-07-26 MED ORDER — BASAGLAR KWIKPEN 100 UNIT/ML ~~LOC~~ SOPN: 35.0000 [IU] | PEN_INJECTOR | Freq: Two times a day (BID) | SUBCUTANEOUS | 3 refills | Status: AC

## 2019-07-26 MED ORDER — INSULIN ASPART 100 UNIT/ML ~~LOC~~ SOLN: 0.0000 [IU] | Freq: Three times a day (TID) | SUBCUTANEOUS | 11 refills | Status: DC

## 2019-07-26 MED ORDER — PEN NEEDLES 31G X 8 MM MISC: 11 refills | Status: AC

## 2019-07-26 MED FILL — metFORMIN HCL 1000 MG TABS: 1000 | 30 days supply | Qty: 60 | Fill #0

## 2019-07-26 MED FILL — LISINOPRIL 10 MG TABS: 10 | 30 days supply | Qty: 30 | Fill #0

## 2019-07-26 MED FILL — TRUE METRIX BLOOD GLUCOSE M: W/DEVICE | 1 days supply | Qty: 1 | Fill #0

## 2019-07-26 MED FILL — HUMALOG 100 UNITS/ML KWIKPE: 100 | 20 days supply | Qty: 12 | Fill #0

## 2019-07-26 MED FILL — BASAGLAR 100 UNIT/ML KWIKPE: 100 | 15 days supply | Qty: 15 | Fill #0

## 2019-07-26 MED FILL — TRUE METRIX GLUCOSE TEST ST: 25 days supply | Qty: 100 | Fill #0

## 2019-07-26 MED FILL — PENTIPS 31G X 8 MM MISC: 31G X 8 MM | 25 days supply | Qty: 100 | Fill #0

## 2019-07-26 MED FILL — TRUEplus LANCETS 28G MISC: 25 days supply | Qty: 100 | Fill #0

## 2019-07-26 NOTE — Discharge Summary (Signed)
Physician Discharge Summary  Tim Choi LPF:[id number] DOB: 1986/05/01 DOA: 07/24/2019  PCP: Patient, No Pcp Per  Admit date: 07/24/2019 Discharge date: 07/26/2019  Admitted From:  Disposition:    Recommendations for Outpatient Follow-up:  1. Follow up with PCP in 1-2 weeks 2. Please obtain BMP/CBC in one week. 3. We started him on lisinopril and Metformin along with Lantus and NovoLog-please titrate accordingly. 4. Please follow up on the following pending results:None  Home Health: No Equipment/Devices: None Discharge Condition: Stable CODE STATUS: Full Diet recommendation: Heart Healthy / Carb Modified    Brief/Interim Summary: Tim Shortis a 34 y.o.malewithhistory of diabetes mellitus probably type II on insulin and has not been taking it for last almost a month because of financial issues came to ED with fatigue and weakness.  Found to have elevated blood sugars and 800.  Admitted for HHS.  Initially treated with insulin infusion. Later his diabetes was managed with Lantus and NovoLog. A1c elevated at 16.5.  CBG improving. Patient [facility] management.  He was also started on Metformin and given 1 month supply of medicine from hospital.  Blood pressure remained elevated.  He was started on lisinopril 10 mg daily. PCP should be able to titrate as needed.  Discharge Diagnoses:  Principal Problem:   Type 2 diabetes mellitus with hyperosmolar nonketotic hyperglycemia (HCC) Active Problems:   Diabetes mellitus due to underlying condition, uncontrolled ([place])   Hyperosmolar non-ketotic state due to type 2 diabetes mellitus ([place])   Hyperosmolar hyperglycemic state (HHS) [facility])  Discharge Instructions  Discharge Instructions    Diet - low sodium heart healthy   Complete by: As directed    Diet Carb Modified   Complete by: As directed    Discharge instructions   Complete by: As directed    It was  pleasure taking care of you. Then made a follow-up appointment at wellness clinic located at [facility]. Please follow-up with them according to your scheduled appointment for further management. I am starting you on Metformin 1000 mg twice daily, in some patients it can cause nausea or diarrhea, if you experience these symptoms you can stop taking it and contact your PCP. I am also starting you on lisinopril 10 mg daily for your high blood pressure. You will take Lantus 35 units twice daily and NovoLog 10 to 20 units with meals according to your blood sugar level. Please continue checking your blood sugar 3-4 times a day, before meals and at bedtime. It is very important that you watch your diet and lose weight that will help with your diabetes.   Increase activity slowly   Complete by: As directed      Allergies as of 07/26/2019   No Known Allergies     Medication List    STOP taking these medications   oxyCODONE-acetaminophen 5-325 MG tablet Commonly known as: PERCOCET/ROXICET     TAKE these medications   blood glucose meter kit and supplies Kit Dispense based on patient and insurance preference. Use up to four times daily as directed. (FOR ICD-9 250.00, 250.01).   insulin aspart 100 UNIT/ML injection Commonly known as: novoLOG Inject 0-20 Units into the skin 3 (three) times daily with meals.   insulin glargine 100 UNIT/ML injection Commonly known as: LANTUS Inject 0.35 mLs (35 Units total) into the skin 2 (two) times daily.   lisinopril 10 MG tablet Commonly known as: ZESTRIL Take 1 tablet (10 mg total) by mouth daily.  Start taking on: July 27, 2019   metFORMIN 1000 MG tablet Commonly known as: Glucophage Take 1 tablet (1,000 mg total) by mouth 2 (two) times daily with a meal.   naproxen 500 MG tablet Commonly known as: NAPROSYN Take 1 tablet (500 mg total) by mouth 2 (two) times daily.      Follow-up Information    Elsie Stain, MD Follow up on 07/29/2019.    Specialty: Pulmonary Disease Why: Appointment scheduled for 9:00 AM please (Arrive by 8:45 AM) Please arrive 15 minutes prior to your appointment. This will allow [place] to verify and update your medical record and ensure a full appointment for you within the time allotted. Contact information: [address] [address] [phone]          No Known Allergies  Consultations:  None  Procedures/Studies: DG Chest Port 1 View  Result Date: 07/24/2019 CLINICAL DATA:  34 year old male with shortness of breath hypoglycemia. EXAM: PORTABLE CHEST 1 VIEW COMPARISON:  Portable chest 04/02/2016. FINDINGS: Portable AP upright views at 1930 hours. Mildly increased elevation of the right hemidiaphragm since 2017. Mediastinal contours remain normal. Visualized tracheal air column is within normal limits. Allowing for portable technique the lungs are clear. No pneumothorax. Paucity of bowel gas in the upper abdomen. No osseous abnormality identified. IMPRESSION: Negative portable chest. Electronically Signed   By: Genevie Ann M.D.   On: 07/24/2019 19:39    Subjective: Patient was feeling better when seen this morning.  He would like to be discharged.  Discussed the importance of staying compliant with his medications and losing weight.  He seems understanding.  Discharge Exam: Vitals:   07/26/19 0431 07/26/19 0736  BP: 131/89 (!) 134/93  Pulse: 70 70  Resp: 16   Temp: 97.9 F (36.6 C) 98.2 F (36.8 C)  SpO2: 100% 100%   Vitals:   07/26/19 0118 07/26/19 0431 07/26/19 0435 07/26/19 0736  BP: 125/78 131/89  (!) 134/93  Pulse: 76 70  70  Resp: 18 16    Temp: 97.8 F (36.6 C) 97.9 F (36.6 C)  98.2 F (36.8 C)  TempSrc: Oral Oral  Oral  SpO2: 98% 100%  100%  Weight:   (!) 158.1 kg   Height:        General: Pt is alert, awake, not in acute distress Cardiovascular: RRR, S1/S2 +, no rubs, no gallops Respiratory: CTA bilaterally, no wheezing, no rhonchi Abdominal: Soft, NT,  ND, bowel sounds + Extremities: no edema, no cyanosis   The results of significant diagnostics from this hospitalization (including imaging, microbiology, ancillary and laboratory) are listed below for reference.    Microbiology: Recent Results (from the past 240 hour(s))  SARS CORONAVIRUS 2 (TAT 6-24 HRS) Nasopharyngeal Nasopharyngeal Swab     Status: None   Collection Time: 07/24/19  8:14 PM   Specimen: Nasopharyngeal Swab  Result Value Ref Range Status   SARS Coronavirus 2 NEGATIVE NEGATIVE Final    Comment: (NOTE) SARS-CoV-2 target nucleic acids are NOT DETECTED. The SARS-CoV-2 RNA is generally detectable in upper and lower respiratory specimens during the acute phase of infection. Negative results do not preclude SARS-CoV-2 infection, do not rule out co-infections with other pathogens, and should not be used as the sole basis for treatment or other patient management decisions. Negative results must be combined with clinical observations, patient history, and epidemiological information. The expected result is Negative. Fact Sheet for Patients: SugarRoll.be Fact Sheet for Healthcare Providers: https://www.woods-mathews.com/ This test is not yet  approved or cleared by the [place] and  has been authorized for detection and/or diagnosis of SARS-CoV-2 by FDA under an Emergency Use Authorization (EUA). This EUA will remain  in effect (meaning this test can be used) for the duration of the COVID-19 declaration under Section 56 4(b)(1) of the Act, 21 U.S.C. section 360bbb-3(b)(1), unless the authorization is terminated or revoked sooner. Performed at [facility], [place] [address], [place], [place] [address]      Labs: BNP (last 3 results) Recent Labs    07/24/19 1915  BNP 10.3   Basic Metabolic Panel: Recent Labs  Lab 07/24/19 1619 07/24/19 2105 07/24/19 2347 07/25/19 0255 07/25/19 0657  NA 128* 131* 138 138  139  K 4.7 4.2 3.5 4.1 4.1  CL 91* 98 103 102 102  CO2 22 24 25 26 26   GLUCOSE 857* 610* 222* 243* 345*  BUN 18 16 15 13 12   CREATININE 1.18 0.98 0.80 0.77 0.89  CALCIUM 9.6 8.4* 9.1 8.9 8.8*   Liver Function Tests: No results for input(s): AST, ALT, ALKPHOS, BILITOT, PROT, ALBUMIN in the last 168 hours. No results for input(s): LIPASE, AMYLASE in the last 168 hours. No results for input(s): AMMONIA in the last 168 hours. CBC: Recent Labs  Lab 07/24/19 1619 07/24/19 2347  WBC 6.5 6.8  HGB 15.6 14.7  HCT 46.3 41.5  MCV 84.3 81.4  PLT 221 201   Cardiac Enzymes: No results for input(s): CKTOTAL, CKMB, CKMBINDEX, TROPONINI in the last 168 hours. BNP: Invalid input(s): POCBNP CBG: Recent Labs  Lab 07/25/19 0745 07/25/19 1203 07/25/19 1634 07/25/19 2048 07/26/19 0726  GLUCAP 290* 408* 380* 312* 302*   D-Dimer No results for input(s): DDIMER in the last 72 hours. Hgb A1c Recent Labs    07/24/19 2347  HGBA1C 16.5*   Lipid Profile No results for input(s): CHOL, HDL, LDLCALC, TRIG, CHOLHDL, LDLDIRECT in the last 72 hours. Thyroid function studies No results for input(s): TSH, T4TOTAL, T3FREE, THYROIDAB in the last 72 hours.  Invalid input(s): FREET3 Anemia work up No results for input(s): VITAMINB12, FOLATE, FERRITIN, TIBC, IRON, RETICCTPCT in the last 72 hours. Urinalysis    Component Value Date/Time   COLORURINE STRAW (A) 07/24/2019 1630   APPEARANCEUR CLEAR 07/24/2019 1630   LABSPEC 1.027 07/24/2019 1630   PHURINE 5.0 07/24/2019 1630   GLUCOSEU >=500 (A) 07/24/2019 1630   HGBUR NEGATIVE 07/24/2019 1630   BILIRUBINUR NEGATIVE 07/24/2019 1630   KETONESUR NEGATIVE 07/24/2019 1630   PROTEINUR NEGATIVE 07/24/2019 1630   NITRITE NEGATIVE 07/24/2019 1630   LEUKOCYTESUR NEGATIVE 07/24/2019 1630   Sepsis Labs Invalid input(s): PROCALCITONIN,  WBC,  LACTICIDVEN Microbiology Recent Results (from the past 240 hour(s))  SARS CORONAVIRUS 2 (TAT 6-24 HRS)  Nasopharyngeal Nasopharyngeal Swab     Status: None   Collection Time: 07/24/19  8:14 PM   Specimen: Nasopharyngeal Swab  Result Value Ref Range Status   SARS Coronavirus 2 NEGATIVE NEGATIVE Final    Comment: (NOTE) SARS-CoV-2 target nucleic acids are NOT DETECTED. The SARS-CoV-2 RNA is generally detectable in upper and lower respiratory specimens during the acute phase of infection. Negative results do not preclude SARS-CoV-2 infection, do not rule out co-infections with other pathogens, and should not be used as the sole basis for treatment or other patient management decisions. Negative results must be combined with clinical observations, patient history, and epidemiological information. The expected result is Negative. Fact Sheet for Patients: SugarRoll.be Fact Sheet for Healthcare Providers: https://www.woods-mathews.com/ This test  is not yet approved or cleared by the [place] and  has been authorized for detection and/or diagnosis of SARS-CoV-2 by FDA under an Emergency Use Authorization (EUA). This EUA will remain  in effect (meaning this test can be used) for the duration of the COVID-19 declaration under Section 56 4(b)(1) of the Act, 21 U.S.C. section 360bbb-3(b)(1), unless the authorization is terminated or revoked sooner. Performed at [facility], [place] [address] [place], [place] [address]     Time coordinating discharge: Over 30 minutes  SIGNED:  Lorella Nimrod, MD  [facility] 07/26/2019, 9:55 AM Pager [phone]  If 7PM-7AM, please contact night-coverage [email or web address] Password [email or web address]  This record has been created using Systems analyst. Errors have been sought and corrected,but may not always be located. Such creation errors do not reflect on the standard of care.

## 2019-07-26 NOTE — Care Management (Signed)
1011 07-26-19 MATCH completed for a one time free fill for medications. Patient is aware that medications will be delivered to the room prior to transition home. Per patient he is calling his fiance for transportation home. Patient states he has a glucose meter at home. Patient is aware of appointment on Monday and not to miss the scheduled appointment. No further needs from Case Manager at this time. Gala Lewandowsky, RN,BSN Case Manager.

## 2019-07-26 NOTE — Care Management (Signed)
1331 07-26-19 Patient will transition home via [employer] today. Call placed and driver to pick the patient up within 10 minutes. No further needs from Case Manager at this time. Gala Lewandowsky, RN,BSN

## 2019-07-26 NOTE — Discharge Instructions (Signed)
Hyperglycemia Hyperglycemia occurs when the level of sugar (glucose) in the blood is too high. Glucose is a type of sugar that provides the body's main source of energy. Certain hormones (insulin and glucagon) control the level of glucose in the blood. Insulin lowers blood glucose, and glucagon increases blood glucose. Hyperglycemia can result from having too little insulin in the bloodstream, or from the body not responding normally to insulin. Hyperglycemia occurs most often in people who have diabetes (diabetes mellitus), but it can happen in people who do not have diabetes. It can develop quickly, and it can be life-threatening if it causes you to become severely dehydrated (diabetic ketoacidosis or hyperglycemic hyperosmolar state). Severe hyperglycemia is a medical emergency. What are the causes? If you have diabetes, hyperglycemia may be caused by:  Diabetes medicine.  Medicines that increase blood glucose or affect your diabetes control.  Not eating enough, or not eating often enough.  Changes in physical activity level.  Being sick or having an infection. If you have prediabetes or undiagnosed diabetes:  Hyperglycemia may be caused by those conditions. If you do not have diabetes, hyperglycemia may be caused by:  Certain medicines, including steroid medicines, beta-blockers, epinephrine, and thiazide diuretics.  Stress.  Serious illness.  Surgery.  Diseases of the pancreas.  Infection. What increases the risk? Hyperglycemia is more likely to develop in people who have risk factors for diabetes, such as:  Having a family member with diabetes.  Having a gene for type 1 diabetes that is passed from parent to child (inherited).  Living in an area with cold weather conditions.  Exposure to certain viruses.  Certain conditions in which the body's disease-fighting (immune) system attacks itself (autoimmune disorders).  Being overweight or obese.  Having an inactive  (sedentary) lifestyle.  Having been diagnosed with insulin resistance.  Having a history of prediabetes, gestational diabetes, or polycystic ovarian syndrome (PCOS).  Being of American-Indian, African-American, Hispanic/Latino, or Asian/Pacific Islander descent. What are the signs or symptoms? Hyperglycemia may not cause any symptoms. If you do have symptoms, they may include early warning signs, such as:  Increased thirst.  Hunger.  Feeling very tired.  Needing to urinate more often than usual.  Blurry vision. Other symptoms may develop if hyperglycemia gets worse, such as:  Dry mouth.  Loss of appetite.  Fruity-smelling breath.  Weakness.  Unexpected or rapid weight gain or weight loss.  Tingling or numbness in the hands or feet.  Headache.  Skin that does not quickly return to normal after being lightly pinched and released (poor skin turgor).  Abdominal pain.  Cuts or bruises that are slow to heal. How is this diagnosed? Hyperglycemia is diagnosed with a blood test to measure your blood glucose level. This blood test is usually done while you are having symptoms. Your health care provider may also do a physical exam and review your medical history. You may have more tests to determine the cause of your hyperglycemia, such as:  A fasting blood glucose (FBG) test. You will not be allowed to eat (you will fast) for at least 8 hours before a blood sample is taken.  An A1c (hemoglobin A1c) blood test. This provides information about blood glucose control over the previous 2-3 months.  An oral glucose tolerance test (OGTT). This measures your blood glucose at two times: ? After fasting. This is your baseline blood glucose level. ? Two hours after drinking a beverage that contains glucose. How is this treated? Treatment depends on the cause   of your hyperglycemia. Treatment may include:  Taking medicine to regulate your blood glucose levels. If you take insulin or  other diabetes medicines, your medicine or dosage may be adjusted.  Lifestyle changes, such as exercising more, eating healthier foods, or losing weight.  Treating an illness or infection, if this caused your hyperglycemia.  Checking your blood glucose more often.  Stopping or reducing steroid medicines, if these caused your hyperglycemia. If your hyperglycemia becomes severe and it results in hyperglycemic hyperosmolar state, you must be hospitalized and given IV fluids. Follow these instructions at home:  General instructions  Take over-the-counter and prescription medicines only as told by your health care provider.  Do not use any products that contain nicotine or tobacco, such as cigarettes and e-cigarettes. If you need help quitting, ask your health care provider.  Limit alcohol intake to no more than 1 drink per day for nonpregnant women and 2 drinks per day for men. One drink equals 12 oz of beer, 5 oz of wine, or 1 oz of hard liquor.  Learn to manage stress. If you need help with this, ask your health care provider.  Keep all follow-up visits as told by your health care provider. This is important. Eating and drinking   Maintain a healthy weight.  Exercise regularly, as directed by your health care provider.  Stay hydrated, especially when you exercise, get sick, or spend time in hot temperatures.  Eat healthy foods, such as: ? Lean proteins. ? Complex carbohydrates. ? Fresh fruits and vegetables. ? Low-fat dairy products. ? Healthy fats.  Drink enough fluid to keep your urine clear or pale yellow. If you have diabetes:  Make sure you know the symptoms of hyperglycemia.  Follow your diabetes management plan, as told by your health care provider. Make sure you: ? Take your insulin and medicines as directed. ? Follow your exercise plan. ? Follow your meal plan. Eat on time, and do not skip meals. ? Check your blood glucose as often as directed. Make sure to  check your blood glucose before and after exercise. If you exercise longer or in a different way than usual, check your blood glucose more often. ? Follow your sick day plan whenever you cannot eat or drink normally. Make this plan in advance with your health care provider.  Share your diabetes management plan with people in your workplace, school, and household.  Check your urine for ketones when you are ill and as told by your health care provider.  Carry a medical alert card or wear medical alert jewelry. Contact a health care provider if:  Your blood glucose is at or above 240 mg/dL (13.3 mmol/L) for 2 days in a row.  You have problems keeping your blood glucose in your target range.  You have frequent episodes of hyperglycemia. Get help right away if:  You have difficulty breathing.  You have a change in how you think, feel, or act (mental status).  You have nausea or vomiting that does not go away. These symptoms may represent a serious problem that is an emergency. Do not wait to see if the symptoms will go away. Get medical help right away. Call your local emergency services (911 in the [place] Do not drive yourself to the hospital. Summary  Hyperglycemia occurs when the level of sugar (glucose) in the blood is too high.  Hyperglycemia is diagnosed with a blood test to measure your blood glucose level. This blood test is usually done while you are   having symptoms. Your health care provider may also do a physical exam and review your medical history.  If you have diabetes, follow your diabetes management plan as told by your health care provider.  Contact your health care provider if you have problems keeping your blood glucose in your target range. This information is not intended to replace advice given to you by your health care provider. Make sure you discuss any questions you have with your health care provider. Document Revised: 03/14/2016 Document Reviewed:  03/14/2016 Elsevier Patient Education  2020 [employer]   Hyperglycemic Hyperosmolar State Hyperglycemic hyperosmolar state is a serious condition in which you experience an extreme increase in your blood sugar (glucose) level. This makes your body become extremely dehydrated, which can be life-threatening. This condition is a result of uncontrolled or undiagnosed diabetes. It occurs most often in people who have type 2 diabetes (type 2 diabetes mellitus). Certain hormones (insulin and glucagon) control the level of glucose that is in the blood. Insulin lowers blood glucose, and glucagon increases blood glucose. Hyperglycemia can result from having too little insulin in the bloodstream, or from the body not responding normally to insulin. Normally, the body gets rid of excess glucose through urine. If you do not drink enough fluids, or if you drink fluids that contain sugar, your body cannot get rid of excess glucose. This can result in hyperglycemic hyperosmolar state. What are the causes? This condition may be caused by:  Infection.  Medicines that cause you to become dehydrated or cause you to lose fluid.  Certain illnesses.  Not taking your diabetes medicine.  New onset or diagnosis of diabetes.  Cardiovascular disease (CVD). What increases the risk? The following factors make you more likely to develop this condition:  Older age.  Poor management of diabetes.  Inability to eat or drink normally.  Heart failure.  Infection.  Surgery.  Illness. What are the signs or symptoms? Symptoms of this condition include:  Extreme or increased thirst. This symptom may gradually disappear.  Needing to urinate more often than usual.  Dry mouth.  Warm, dry skin that does not sweat even in high temperatures.  High fever.  Sleepiness or confusion.  Vision problems or vision loss.  Seeing, hearing, tasting, smelling, or feeling things that are not real  (hallucinations).  Weakness.  Weight loss.  Vomiting. How is this diagnosed? Hyperglycemic hyperosmolar state is diagnosed based on your medical history, your symptoms, and a blood test to measure your blood glucose level. How is this treated? This condition is treated in the hospital. The goals of treatment are:  To correct dehydration by replacing fluids that you have lost. Fluids will be given through an IV tube.  To improve blood sugar levels using insulin or other medicines as needed.  To treat the cause of hyperglycemia, such as an infection, illness, or newly diagnosed diabetes. Follow these instructions at home: General instructions  Take over-the-counter and prescription medicines only as told by your health care provider.  Do not use any products that contain nicotine or tobacco, such as cigarettes and e-cigarettes. If you need help quitting, ask your health care provider.  Limit alcohol intake to no more than 1 drink a day for nonpregnant women and 2 drinks a day for men. One drink equals 12 oz of beer, 5 oz of wine, or 1 oz of hard liquor.  Stay hydrated, especially when you exercise, when you get sick, or when you spend time in hot temperatures.  Learn  to manage stress. If you need help with this, ask your health care provider.  Keep all follow-up visits as told by your health care provider. This is important. Eating and drinking   Maintain a healthy weight.  Exercise regularly, as directed by your health care provider.  Eat healthy foods, such as: ? Lean proteins. ? Complex carbohydrates. ? Fresh fruits and vegetables. ? Low-fat dairy products. ? Healthy fats.  Drink enough fluid to keep your urine clear or pale yellow. If You Have Diabetes:   Make sure you know the early signs and symptoms of hyperglycemia.  Follow your diabetes management plan, as told by your health care provider. Make sure you: ? Take your insulin and medicines as  directed. ? Follow your exercise plan. ? Follow your meal plan. Eat on time, and do not skip meals. ? Check your blood glucose as often as directed. Make sure to check your blood glucose before and after exercise. If you exercise longer or in a different way than usual, check your blood glucose more often. ? Follow your sick day plan whenever you cannot eat or drink normally. Make this plan in advance with your health care provider.  Share your diabetes management plan with people in your workplace, school, and household.  Check your urine for ketones when you are ill and as often as told by your health care provider.  Carry a medical alert card or wear medical alert jewelry. Contact a health care provider if:  You cannot eat or drink without throwing up.  You develop a fever. Get help right away if:  You develop symptoms of hyperglycemic hyperosmolar state. These symptoms may represent a serious problem that is an emergency. Do not wait to see if the symptoms will go away. Get medical help right away. Call your local emergency services (911 in the [place] Do not drive yourself to the hospital. Summary  Hyperglycemic hyperosmolar state is a serious condition in which you experience an extreme increase in your blood sugar (glucose) level. This makes your body become extremely dehydrated, which can be life-threatening.  This condition is a result of uncontrolled or undiagnosed diabetes. It occurs most often in people who have type 2 diabetes (type 2 diabetes mellitus).  This condition is treated in the hospital. Treatment may include fluids given through an IV tube and other medicines.  Make sure you know the early signs and symptoms of hyperglycemia.  Follow your diabetes management plan, as told by your health care provider. This information is not intended to replace advice given to you by your health care provider. Make sure you discuss any questions you have with your health care  provider. Document Revised: 06/20/2016 Document Reviewed: 06/20/2016 Elsevier Patient Education  2020 [employer].

## 2019-07-28 NOTE — Progress Notes (Deleted)
   Subjective:    Patient ID: Tim Choi, male    DOB: September 25, 1985, 34 y.o.   MRN: [id number]  Post hosp f/u DM2  34 y.o.Male   Admit date: 07/24/2019 Discharge date: 07/26/2019  Admitted From:  Disposition:    Recommendations for Outpatient Follow-up:  1. Follow up with PCP in 1-2 weeks 2. Please obtain BMP/CBC in one week. 3. We started him on lisinopril and Metformin along with Lantus and NovoLog-please titrate accordingly. 4. Please follow up on the following pending results:None  Home Health: No Equipment/Devices: None Discharge Condition: Stable CODE STATUS: Full Diet recommendation: Heart Healthy / Carb Modified    Brief/Interim Summary: Tim Choi a 33 y.o.malewithhistory of diabetes mellitus probably type II on insulin and has not been taking it for last almost a month because of financial issuescame to ED with fatigue and weakness. Found to have elevated blood sugars and 800. Admitted for HHS.Initially treated with insulin infusion. Later his diabetes was managed with Lantus and NovoLog. A1c elevated at 16.5.  CBG improving. [facility].  He was also started on Metformin and given 1 month supply of medicine from hospital.  Blood pressure remained elevated.  He was started on lisinopril 10 mg daily. PCP should be able to titrate as needed.  Discharge Diagnoses:  Principal Problem:   Type 2 diabetes mellitus with hyperosmolar nonketotic hyperglycemia (HCC) Active Problems:   Diabetes mellitus due to underlying condition, uncontrolled (HCC)   Hyperosmolar non-ketotic state due to type 2 diabetes mellitus (HCC)   Hyperosmolar hyperglycemic state ([facility]HS) (HCC)     Review of Systems     Objective:   Physical Exam        Assessment & Plan:

## 2019-07-29 ENCOUNTER — Ambulatory Visit: Payer: Self-pay | Admitting: Critical Care Medicine
